# Patient Record
Sex: Female | Born: 1985 | Race: White | Hispanic: No | Smoking: Current every day smoker
Health system: Southern US, Community
[De-identification: ages and names within clinical notes are randomized; demographics above are authoritative.]

## PROBLEM LIST (undated history)

## (undated) ENCOUNTER — Inpatient Hospital Stay (HOSPITAL_COMMUNITY): Payer: Self-pay

## (undated) DIAGNOSIS — G4730 Sleep apnea, unspecified: Secondary | ICD-10-CM

## (undated) HISTORY — PX: KNEE ARTHROSCOPY W/ ACL RECONSTRUCTION: SHX1858

## (undated) HISTORY — PX: CHOLECYSTECTOMY: SHX55

## (undated) HISTORY — PX: TONSILLECTOMY: SUR1361

---

## 2015-06-14 ENCOUNTER — Encounter (HOSPITAL_COMMUNITY): Payer: Self-pay | Admitting: *Deleted

## 2015-06-14 ENCOUNTER — Inpatient Hospital Stay (HOSPITAL_COMMUNITY)
Admission: AD | Admit: 2015-06-14 | Discharge: 2015-06-14 | Disposition: A | Discharge status: Home / Self Care | Payer: Medicaid - Out of State | Source: Ambulatory Visit | Attending: Family Medicine | Admitting: Family Medicine

## 2015-06-14 ENCOUNTER — Inpatient Hospital Stay (HOSPITAL_COMMUNITY): Payer: Medicaid - Out of State

## 2015-06-14 ENCOUNTER — Other Ambulatory Visit: Payer: Self-pay | Admitting: Student

## 2015-06-14 DIAGNOSIS — O99331 Smoking (tobacco) complicating pregnancy, first trimester: Secondary | ICD-10-CM | POA: Diagnosis not present

## 2015-06-14 DIAGNOSIS — O26893 Other specified pregnancy related conditions, third trimester: Secondary | ICD-10-CM | POA: Insufficient documentation

## 2015-06-14 DIAGNOSIS — O26899 Other specified pregnancy related conditions, unspecified trimester: Secondary | ICD-10-CM

## 2015-06-14 DIAGNOSIS — Z3A01 Less than 8 weeks gestation of pregnancy: Secondary | ICD-10-CM | POA: Diagnosis not present

## 2015-06-14 DIAGNOSIS — F17210 Nicotine dependence, cigarettes, uncomplicated: Secondary | ICD-10-CM | POA: Insufficient documentation

## 2015-06-14 DIAGNOSIS — O43891 Other placental disorders, first trimester: Secondary | ICD-10-CM | POA: Diagnosis not present

## 2015-06-14 DIAGNOSIS — O418X10 Other specified disorders of amniotic fluid and membranes, first trimester, not applicable or unspecified: Secondary | ICD-10-CM

## 2015-06-14 DIAGNOSIS — O368910 Maternal care for other specified fetal problems, first trimester, not applicable or unspecified: Secondary | ICD-10-CM | POA: Diagnosis not present

## 2015-06-14 DIAGNOSIS — R109 Unspecified abdominal pain: Secondary | ICD-10-CM | POA: Diagnosis present

## 2015-06-14 DIAGNOSIS — O468X1 Other antepartum hemorrhage, first trimester: Secondary | ICD-10-CM

## 2015-06-14 HISTORY — DX: Sleep apnea, unspecified: G47.30

## 2015-06-14 LAB — CBC
HCT: 37.3 % (ref 36.0–46.0)
HEMOGLOBIN: 12.8 g/dL (ref 12.0–15.0)
MCH: 31.8 pg (ref 26.0–34.0)
MCHC: 34.3 g/dL (ref 30.0–36.0)
MCV: 92.6 fL (ref 78.0–100.0)
PLATELETS: 267 10*3/uL (ref 150–400)
RBC: 4.03 MIL/uL (ref 3.87–5.11)
RDW: 13.4 % (ref 11.5–15.5)
WBC: 11.3 10*3/uL — AB (ref 4.0–10.5)

## 2015-06-14 LAB — ABO/RH: ABO/RH(D): A POS

## 2015-06-14 LAB — WET PREP, GENITAL
CLUE CELLS WET PREP: NONE SEEN
TRICH WET PREP: NONE SEEN
YEAST WET PREP: NONE SEEN

## 2015-06-14 LAB — URINALYSIS, ROUTINE W REFLEX MICROSCOPIC
Bilirubin Urine: NEGATIVE
GLUCOSE, UA: NEGATIVE mg/dL
Hgb urine dipstick: NEGATIVE
KETONES UR: NEGATIVE mg/dL
LEUKOCYTES UA: NEGATIVE
NITRITE: NEGATIVE
PH: 6.5 (ref 5.0–8.0)
Protein, ur: NEGATIVE mg/dL
Specific Gravity, Urine: 1.025 (ref 1.005–1.030)
Urobilinogen, UA: 1 mg/dL (ref 0.0–1.0)

## 2015-06-14 LAB — POCT PREGNANCY, URINE: Preg Test, Ur: POSITIVE — AB

## 2015-06-14 LAB — HCG, QUANTITATIVE, PREGNANCY: hCG, Beta Chain, Quant, S: 17717 m[IU]/mL — ABNORMAL HIGH (ref <5)

## 2015-06-14 MED ORDER — PROMETHAZINE HCL 12.5 MG PO TABS: 12.5000 mg | ORAL_TABLET | Freq: Four times a day (QID) | ORAL | Status: AC | PRN

## 2015-06-14 MED ORDER — METOCLOPRAMIDE HCL 10 MG PO TABS
10.0000 mg | ORAL_TABLET | Freq: Once | ORAL | Status: AC
  Administered 2015-06-14: 10 mg via ORAL
  Filled 2015-06-14: qty 1

## 2015-06-14 NOTE — [facility] Provider Note (Signed)
History     CSN: [id number]  Arrival date and time: 06/14/15 1216   First Provider Initiated Contact with Patient 06/14/15 1320         Chief Complaint  Patient presents with  . Abdominal Pain   HPI  Alexis [name] is a 29 y.o. G4P0030 at [redacted]w[redacted]d by LMP who presents with abdominal pain.  Abdominal pain has been intermittent for the last week. Rates pain 6/10 & describes as cramp like. Has not treated pain. Worse right before she voids. Denies painful urination or any other urinary symptoms.  Denies vaginal bleeding or discharge.  Denies fever.    OB History    Gravida Para Term Preterm AB TAB SAB Ectopic Multiple Living   4 0 0 0 3 0 3 0 0 0       Past Medical History  Diagnosis Date  . Sleep apnea     Past Surgical History  Procedure Laterality Date  . Knee arthroscopy w/ acl reconstruction    . Tonsillectomy    . Cholecystectomy      History reviewed. No pertinent family history.  Social History  Substance Use Topics  . Smoking status: Current Every Day Smoker    Types: Cigarettes  . Smokeless tobacco: None  . Alcohol Use: No    Allergies: No Known Allergies  No prescriptions prior to admission    Review of Systems  Constitutional: Negative.   Gastrointestinal: Positive for nausea, vomiting and abdominal pain. Negative for diarrhea and constipation.  Genitourinary: Negative for dysuria, urgency and frequency.       No vaginal bleeding or discharge   Physical Exam   Blood pressure 122/72, pulse 82, temperature 98.2 F (36.8 C), temperature source Oral, resp. rate 18, height 5\' 5"  (1.651 m), weight 113.127 kg (249 lb 6.4 oz), last menstrual period 04/29/2015.  Physical Exam  Nursing note and vitals reviewed. Constitutional: She is oriented to person, place, and time. She appears well-developed and well-nourished. No distress.  HENT:  Head: Normocephalic and atraumatic.  Eyes: Conjunctivae are normal. Right eye exhibits no discharge. Left eye exhibits  no discharge. No scleral icterus.  Neck: Normal range of motion.  Cardiovascular: Normal rate, regular rhythm and normal heart sounds.   No murmur heard. Respiratory: Effort normal and breath sounds normal. No respiratory distress. She has no wheezes.  GI: Soft. There is no tenderness.  Genitourinary: Vagina normal and uterus normal. Cervix exhibits no motion tenderness, no discharge and no friability.  Neurological: She is alert and oriented to person, place, and time.  Skin: Skin is warm and dry. She is not diaphoretic.  Psychiatric: She has a normal mood and affect. Her behavior is normal. Judgment and thought content normal.    MAU Course  Procedures Results for orders placed or performed during the hospital encounter of 06/14/15 (from the past 24 hour(s))  Urinalysis, Routine w reflex microscopic (not at [facility])     Status: None   Collection Time: 06/14/15 12:50 PM  Result Value Ref Range   Color, Urine YELLOW YELLOW   APPearance CLEAR CLEAR   Specific Gravity, Urine 1.025 1.005 - 1.030   pH 6.5 5.0 - 8.0   Glucose, UA NEGATIVE NEGATIVE mg/dL   Hgb urine dipstick NEGATIVE NEGATIVE   Bilirubin Urine NEGATIVE NEGATIVE   Ketones, ur NEGATIVE NEGATIVE mg/dL   Protein, ur NEGATIVE NEGATIVE mg/dL   Urobilinogen, UA 1.0 0.0 - 1.0 mg/dL   Nitrite NEGATIVE NEGATIVE   Leukocytes, UA NEGATIVE NEGATIVE  Pregnancy, urine  POC     Status: Abnormal   Collection Time: 06/14/15  1:00 PM  Result Value Ref Range   Preg Test, Ur POSITIVE (A) NEGATIVE  Wet prep, genital     Status: Abnormal   Collection Time: 06/14/15  1:20 PM  Result Value Ref Range   Yeast Wet Prep HPF POC NONE SEEN NONE SEEN   Trich, Wet Prep NONE SEEN NONE SEEN   Clue Cells Wet Prep HPF POC NONE SEEN NONE SEEN   WBC, Wet Prep HPF POC FEW (A) NONE SEEN  CBC     Status: Abnormal   Collection Time: 06/14/15  1:32 PM  Result Value Ref Range   WBC 11.3 (H) 4.0 - 10.5 K/uL   RBC 4.03 3.87 - 5.11 MIL/uL   Hemoglobin 12.8 12.0  - 15.0 g/dL   HCT 40.9 81.1 - 91.4 %   MCV 92.6 78.0 - 100.0 fL   MCH 31.8 26.0 - 34.0 pg   MCHC 34.3 30.0 - 36.0 g/dL   RDW 78.2 95.6 - 21.3 %   Platelets 267 150 - 400 K/uL  ABO/Rh     Status: None (Preliminary result)   Collection Time: 06/14/15  1:33 PM  Result Value Ref Range   ABO/RH(D) A POS   hCG, quantitative, pregnancy     Status: Abnormal   Collection Time: 06/14/15  1:33 PM  Result Value Ref Range   hCG, Beta Chain, Quant, S 17717 (H) <5 mIU/mL   US Ob Comp Less 14 Wks  06/14/2015   CLINICAL DATA:  Cramping with urination. Patient is pregnant with gestational age by last menstrual period 6 weeks and 4 days.  EXAM: OBSTETRIC <14 WK [place] AND TRANSVAGINAL OB US  TECHNIQUE: Both transabdominal and transvaginal ultrasound examinations were performed for complete evaluation of the gestation as well as the maternal uterus, adnexal regions, and pelvic cul-de-sac. Transvaginal technique was performed to assess early pregnancy.  COMPARISON:  None.  FINDINGS: Intrauterine gestational sac: Visualized/normal in shape.  Yolk sac:  Visualized  Embryo:  Visualized  Cardiac Activity: Visualized  Heart Rate: 99  bpm  CRL: 4.2 mm corresponding to 6 w 1 d [place] EDC: 02/06/2016  There is a small subchorionic hemorrhage.  Maternal uterus/adnexae: Right and left ovaries are within normal limits. Right ovary measures 3.5 x 2.2 x 2.4 cm. Left ovary measures 3.6 x 1.9 x 2.9 cm. There is a trace amount of free pelvic fluid.  IMPRESSION: Single live intrauterine pregnancy corresponding to 6 weeks and 1 day gestation. Fetal heart rate is decreased measuring 99 beats per min. Obstetric follow-up is recommended.  Small subchorionic hemorrhage.  Normal appearance of the maternal adnexa.   Electronically Signed   By: Ted Mcalpine M.D.   On: 06/14/2015 14:37   US Ob Transvaginal  06/14/2015   CLINICAL DATA:  Cramping with urination. Patient is pregnant with gestational age by last menstrual period 6 weeks and 4 days.   EXAM: OBSTETRIC <14 WK [place] AND TRANSVAGINAL OB US  TECHNIQUE: Both transabdominal and transvaginal ultrasound examinations were performed for complete evaluation of the gestation as well as the maternal uterus, adnexal regions, and pelvic cul-de-sac. Transvaginal technique was performed to assess early pregnancy.  COMPARISON:  None.  FINDINGS: Intrauterine gestational sac: Visualized/normal in shape.  Yolk sac:  Visualized  Embryo:  Visualized  Cardiac Activity: Visualized  Heart Rate: 99  bpm  CRL: 4.2 mm corresponding to 6 w 1 d [place] EDC: 02/06/2016  There is a small subchorionic  hemorrhage.  Maternal uterus/adnexae: Right and left ovaries are within normal limits. Right ovary measures 3.5 x 2.2 x 2.4 cm. Left ovary measures 3.6 x 1.9 x 2.9 cm. There is a trace amount of free pelvic fluid.  IMPRESSION: Single live intrauterine pregnancy corresponding to 6 weeks and 1 day gestation. Fetal heart rate is decreased measuring 99 beats per min. Obstetric follow-up is recommended.  Small subchorionic hemorrhage.  Normal appearance of the maternal adnexa.   Electronically Signed   By: Ted Mcalpine M.D.   On: 06/14/2015 14:37     MDM Labs: CBC, HCG, HIV, Abo/Rh Ultrasound - [facility] @ [redacted]w[redacted]d GC/CT & wet prep Reglan for nausea A pos Assessment and Plan  A: 1. Abdominal pain in pregnancy   2. Subchorionic hematoma in first trimester     P: Discharge home GC/CT, HIV pending Rx phenergan Discussed reasons to return to [facility] Given pregnancy verification letter & list of providers Start prenatal care  Judeth Horn, NP  06/14/2015, 1:09 PM

## 2015-06-14 NOTE — Discharge Instructions (Signed)
First Trimester of Pregnancy The first trimester of pregnancy is from week 1 until the end of week 12 (months 1 through 3). During this time, your baby will begin to develop inside you. At 6-8 weeks, the eyes and face are formed, and the heartbeat can be seen on ultrasound. At the end of 12 weeks, all the baby's organs are formed. Prenatal care is all the medical care you receive before the birth of your baby. Make sure you get good prenatal care and follow all of your doctor's instructions. HOME CARE  Medicines  Take medicine only as told by your doctor. Some medicines are safe and some are not during pregnancy.  Take your prenatal vitamins as told by your doctor.  Take medicine that helps you poop (stool softener) as needed if your doctor says it is okay. Diet  Eat regular, healthy meals.  Your doctor will tell you the amount of weight gain that is right for you.  Avoid raw meat and uncooked cheese.  If you feel sick to your stomach (nauseous) or throw up (vomit):  Eat 4 or 5 small meals a day instead of 3 large meals.  Try eating a few soda crackers.  Drink liquids between meals instead of during meals.  If you have a hard time pooping (constipation):  Eat high-fiber foods like fresh vegetables, fruit, and whole grains.  Drink enough fluids to keep your pee (urine) clear or pale yellow. Activity and Exercise  Exercise only as told by your doctor. Stop exercising if you have cramps or pain in your lower belly (abdomen) or low back.  Try to avoid standing for long periods of time. Move your legs often if you must stand in one place for a long time.  Avoid heavy lifting.  Wear low-heeled shoes. Sit and stand up straight.  You can have sex unless your doctor tells you not to. Relief of Pain or Discomfort  Wear a good support bra if your breasts are sore.  Take warm water baths (sitz baths) to soothe pain or discomfort caused by hemorrhoids. Use hemorrhoid cream if your  doctor says it is okay.  Rest with your legs raised if you have leg cramps or low back pain.  Wear support hose if you have puffy, bulging veins (varicose veins) in your legs. Raise (elevate) your feet for 15 minutes, 3-4 times a day. Limit salt in your diet. Prenatal Care  Schedule your prenatal visits by the twelfth week of pregnancy.  Write down your questions. Take them to your prenatal visits.  Keep all your prenatal visits as told by your doctor. Safety  Wear your seat belt at all times when driving.  Make a list of emergency phone numbers. The list should include numbers for family, friends, the hospital, and police and fire departments. General Tips  Ask your doctor for a referral to a local prenatal class. Begin classes no later than at the start of month 6 of your pregnancy.  Ask for help if you need counseling or help with nutrition. Your doctor can give you advice or tell you where to go for help.  Do not use hot tubs, steam rooms, or saunas.  Do not douche or use tampons or scented sanitary pads.  Do not cross your legs for long periods of time.  Avoid litter boxes and soil used by cats.  Avoid all smoking, herbs, and alcohol. Avoid drugs not approved by your doctor.  Visit your dentist. At home, brush your teeth  with a soft toothbrush. Be gentle when you floss. °GET HELP IF: °· You are dizzy. °· You have mild cramps or pressure in your lower belly. °· You have a nagging pain in your belly area. °· You continue to feel sick to your stomach, throw up, or have watery poop (diarrhea). °· You have a bad smelling fluid coming from your vagina. °· You have pain with peeing (urination). °· You have increased puffiness (swelling) in your face, hands, legs, or ankles. °GET HELP RIGHT AWAY IF:  °· You have a fever. °· You are leaking fluid from your vagina. °· You have spotting or bleeding from your vagina. °· You have very bad belly cramping or pain. °· You gain or lose weight  rapidly. °· You throw up blood. It may look like coffee grounds. °· You are around people who have German measles, fifth disease, or chickenpox. °· You have a very bad headache. °· You have shortness of breath. °· You have any kind of trauma, such as from a fall or a car accident. °Document Released: 03/10/2008 Document Revised: 02/06/2014 Document Reviewed: 08/02/2013 °[employer]® Patient Information ©2015 [employer]. This information is not intended to replace advice given to you by your health care provider. Make sure you discuss any questions you have with your health care provider. °Subchorionic Hematoma °A subchorionic hematoma is a gathering of blood between the outer wall of the placenta and the inner wall of the womb (uterus). The placenta is the organ that connects the fetus to the wall of the uterus. The placenta performs the feeding, breathing (oxygen to the fetus), and waste removal (excretory work) of the fetus.  °Subchorionic hematoma is the most common abnormality found on a result from ultrasonography done during the first trimester or early second trimester of pregnancy. If there has been little or no vaginal bleeding, early small hematomas usually shrink on their own and do not affect your baby or pregnancy. The blood is gradually absorbed over 1-2 weeks. When bleeding starts later in pregnancy or the hematoma is larger or occurs in an older pregnant woman, the outcome may not be as good. Larger hematomas may get bigger, which increases the chances for miscarriage. Subchorionic hematoma also increases the risk of premature detachment of the placenta from the uterus, preterm (premature) labor, and stillbirth. °HOME CARE INSTRUCTIONS °· Stay on bed rest if your health care provider recommends this. Although bed rest will not prevent more bleeding or prevent a miscarriage, your health care provider may recommend bed rest until you are advised otherwise. °· Avoid heavy lifting (more than 10 lb [4.5  kg]), exercise, sexual intercourse, or douching as directed by your health care provider. °· Keep track of the number of pads you use each day and how soaked (saturated) they are. Write down this information. °· Do not use tampons. °· Keep all follow-up appointments as directed by your health care provider. Your health care provider may ask you to have follow-up blood tests or ultrasound tests or both. °SEEK IMMEDIATE MEDICAL CARE IF: °· You have severe cramps in your stomach, back, abdomen, or pelvis. °· You have a fever. °· You pass large clots or tissue. Save any tissue for your health care provider to look at. °· Your bleeding increases or you become lightheaded, feel weak, or have fainting episodes. °Document Released: 01/07/2007 Document Revised: 02/06/2014 Document Reviewed: 04/21/2013 °[employer]® Patient Information ©2015 [employer]. This information is not intended to replace advice given to you by your health   your health care provider. Make sure you discuss any questions you have with your health care provider. ° °

## 2015-06-14 NOTE — [facility] Note (Signed)
Pt had positive HPT.Marland Kitchen Has been having abd cramping on and off x1 week. Denies vag bleeding or discharge.

## 2015-06-15 LAB — GC/CHLAMYDIA PROBE AMP (~~LOC~~) NOT AT ARMC
Chlamydia: NEGATIVE
Neisseria Gonorrhea: NEGATIVE

## 2015-06-15 LAB — HIV ANTIBODY (ROUTINE TESTING W REFLEX): HIV SCREEN 4TH GENERATION: NONREACTIVE

## 2016-04-18 ENCOUNTER — Encounter (HOSPITAL_COMMUNITY): Payer: Self-pay | Admitting: *Deleted

## 2016-07-29 ENCOUNTER — Emergency Department
Admission: EM | Admit: 2016-07-29 | Discharge: 2016-07-29 | Disposition: A | Discharge status: Home / Self Care | Payer: Medicaid Other | Attending: Emergency Medicine | Admitting: Emergency Medicine

## 2016-07-29 ENCOUNTER — Emergency Department: Payer: Medicaid Other

## 2016-07-29 ENCOUNTER — Encounter: Payer: Self-pay | Admitting: Emergency Medicine

## 2016-07-29 DIAGNOSIS — F17210 Nicotine dependence, cigarettes, uncomplicated: Secondary | ICD-10-CM | POA: Insufficient documentation

## 2016-07-29 DIAGNOSIS — R1032 Left lower quadrant pain: Secondary | ICD-10-CM | POA: Diagnosis not present

## 2016-07-29 DIAGNOSIS — N764 Abscess of vulva: Secondary | ICD-10-CM | POA: Insufficient documentation

## 2016-07-29 DIAGNOSIS — R102 Pelvic and perineal pain: Secondary | ICD-10-CM | POA: Diagnosis present

## 2016-07-29 LAB — URINALYSIS COMPLETE WITH MICROSCOPIC (ARMC ONLY)
Bilirubin Urine: NEGATIVE
Glucose, UA: NEGATIVE mg/dL
KETONES UR: NEGATIVE mg/dL
LEUKOCYTES UA: NEGATIVE
NITRITE: NEGATIVE
PH: 5 (ref 5.0–8.0)
PROTEIN: NEGATIVE mg/dL
RBC / HPF: NONE SEEN RBC/hpf (ref 0–5)
SPECIFIC GRAVITY, URINE: 1.01 (ref 1.005–1.030)
Squamous Epithelial / LPF: NONE SEEN

## 2016-07-29 LAB — WET PREP, GENITAL
CLUE CELLS WET PREP: NONE SEEN
SPERM: NONE SEEN
Trich, Wet Prep: NONE SEEN
Yeast Wet Prep HPF POC: NONE SEEN

## 2016-07-29 LAB — CHLAMYDIA/NGC RT PCR (ARMC ONLY)
Chlamydia Tr: NOT DETECTED
N gonorrhoeae: NOT DETECTED

## 2016-07-29 LAB — POCT PREGNANCY, URINE: PREG TEST UR: NEGATIVE

## 2016-07-29 MED ORDER — IBUPROFEN 600 MG PO TABS
600.0000 mg | ORAL_TABLET | Freq: Once | ORAL | Status: AC
  Administered 2016-07-29: 600 mg via ORAL
  Filled 2016-07-29: qty 1

## 2016-07-29 MED ORDER — IBUPROFEN 600 MG PO TABS: 600.0000 mg | ORAL_TABLET | Freq: Three times a day (TID) | ORAL | 0 refills | Status: AC | PRN

## 2016-07-29 NOTE — ED Provider Notes (Signed)
[facility] Emergency Department Provider Note  ____________________________________________   First MD Initiated Contact with Patient 07/29/16 [phone]     (approximate)  I have reviewed the triage vital signs and the nursing notes.   HISTORY  Chief Complaint Vaginal Discharge (vaginal pain)    HPI Alexis [name] is a 30 y.o. female with a reported history of only obesity and sleep apnea who presents for evaluation of 2 lesions on her "vaginal area" that have been draining purulent material as well pain in her left lower quadrant.  She has a 65-month-old baby and she reports that she was told at the time of delivery that she had a spot on her ovary that needs to be evaluated for possible cancer.  She has not done so.  She just moved to this area from [place] and does not have a local doctor.  She states that the sharp stabbing pain in her left lower quadrant has been present for 3 months and not getting any better but is also not getting any worse.  Ranges from mild to severe in intensity .  She is sexually active but implanted contraception.  She reports that her urine has been dark and foul-smelling recently. She denies any vaginal discharge but states that the 2 spots on her vulva will show up, drained material, go away, then come back.  These have also been present for months.  She denies fever/chills, chest pain, shortness of breath, nausea, vomiting, dysuria, dyspareunia.   Past Medical History:  Diagnosis Date  . Sleep apnea     There are no active problems to display for this patient.   Past Surgical History:  Procedure Laterality Date  . CHOLECYSTECTOMY    . KNEE ARTHROSCOPY W/ ACL RECONSTRUCTION    . TONSILLECTOMY      Prior to Admission medications   Medication Sig Start Date End Date Taking? Authorizing Provider  ibuprofen (ADVIL,MOTRIN) 600 MG tablet Take 1 tablet (600 mg total) by mouth 3 (three) times daily with meals as needed for moderate  pain. 07/29/16   Loleta Rose, MD  promethazine (PHENERGAN) 12.5 MG tablet Take 1 tablet (12.5 mg total) by mouth every 6 (six) hours as needed for nausea or vomiting. 06/14/15   Duane Lope, NP    Allergies Review of patient's allergies indicates no known allergies.  No family history on file.  Social History Social History  Substance Use Topics  . Smoking status: Current Every Day Smoker    Types: Cigarettes  . Smokeless tobacco: Never Used  . Alcohol use No    Review of Systems Constitutional: No fever/chills Eyes: No visual changes. ENT: No sore throat. Cardiovascular: Denies chest pain. Respiratory: Denies shortness of breath. Gastrointestinal: Left lower quadrant pain 3 months, waxes and wanes.  No nausea, no vomiting.  No diarrhea.  No constipation. Genitourinary: Negative for dysuria. Dark and foul-smelling urine recently.  2 occasionally draining lesions in her vulvar region. Musculoskeletal: Negative for back pain. Skin: Negative for rash. Neurological: Negative for headaches, focal weakness or numbness.  10-point ROS otherwise negative.  ____________________________________________   PHYSICAL EXAM:  VITAL SIGNS: ED Triage Vitals  Enc Vitals [name]     BP 07/29/16 1009 132/81     Pulse Rate 07/29/16 1009 87     Resp 07/29/16 1009 18     Temp 07/29/16 1009 98.1 F (36.7 C)     Temp Source 07/29/16 1009 Oral     SpO2 07/29/16 1009 97 %  Weight 07/29/16 1011 265 lb (120.2 kg)     Height 07/29/16 1011 5\' 5"  (1.651 m)     Head Circumference --      Peak Flow --      Pain Score --      Pain Loc --      Pain Edu? --      Excl. in GC? --     Constitutional: Alert and oriented. Well appearing and in no acute distress. Eyes: Conjunctivae are normal. PERRL. EOMI. Head: Atraumatic. Nose: No congestion/rhinnorhea. Mouth/Throat: Mucous membranes are moist.  Oropharynx non-erythematous. Neck: No stridor.  No meningeal signs.   Cardiovascular: Normal  rate, regular rhythm. Good peripheral circulation. Grossly normal heart sounds. Respiratory: Normal respiratory effort.  No retractions. Lungs CTAB. Gastrointestinal: Soft and nontender. No distention.  Genitourinary: The patient has 2 small lesions on her vulva, one on the upper left and one on the lower right.  They look like abscesses that have drained.  There is no surrounding erythema or fluctuance or induration.  They are nontender to palpation.  There is no evidence of active infection at this time.  There are no external and fascicular lesions.  There is a mild amount of whitish discharge in the vagina.  The cervix is normal in appearance.  On bimanual exam the patient has left adnexal tenderness but no cervical motion tenderness.  Chaperone was present throughout the exam. Musculoskeletal: No lower extremity tenderness nor edema. No gross deformities of extremities. Neurologic:  Normal speech and language. No gross focal neurologic deficits are appreciated.  Skin:  Skin is warm, dry and intact. No rash noted. Psychiatric: Mood and affect are normal. Speech and behavior are normal.  ____________________________________________   LABS (all labs ordered are listed, but only abnormal results are displayed)  Labs Reviewed  WET PREP, GENITAL - Abnormal; Notable for the following:       Result Value   WBC, Wet Prep HPF POC FEW (*)    All other components within normal limits  URINALYSIS COMPLETEWITH MICROSCOPIC ([facility] ONLY) - Abnormal; Notable for the following:    Color, Urine YELLOW (*)    APPearance CLEAR (*)    Hgb urine dipstick 1+ (*)    Bacteria, UA RARE (*)    All other components within normal limits  CHLAMYDIA/NGC RT PCR ([facility] ONLY)  POC URINE PREG, ED  POCT PREGNANCY, URINE   ____________________________________________  EKG  None - EKG not ordered by ED physician ____________________________________________  RADIOLOGY   [place] Transvaginal Non-ob  Result  Date: 07/29/2016 CLINICAL DATA:  Acute left lower quadrant abdominal pain. EXAM: TRANSABDOMINAL AND TRANSVAGINAL ULTRASOUND OF PELVIS TECHNIQUE: Both transabdominal and transvaginal ultrasound examinations of the pelvis were performed. Transabdominal technique was performed for global imaging of the pelvis including uterus, ovaries, adnexal regions, and pelvic cul-de-sac. It was necessary to proceed with endovaginal exam following the transabdominal exam to visualize the endometrium. COMPARISON:  None FINDINGS: Uterus Measurements: 7.4 x 4.9 x 2.5 cm. No fibroids or other mass visualized. Endometrium Thickness: 4 mm which is within normal limits. No focal abnormality visualized. Right ovary Measurements: 3.2 x 2.4 x 2.2 cm. Normal appearance/no adnexal mass. Left ovary Measurements: 3.3 x 2.0 x 1.8 cm. Normal appearance/no adnexal mass. Other findings No abnormal free fluid. IMPRESSION: No significant abnormality seen in the pelvis. Electronically Signed   By: Lupita RaiderJames  Green Jr, M.D.   On: 07/29/2016 12:25   [place] Pelvis Complete  Result Date: 07/29/2016 CLINICAL DATA:  Acute left lower  quadrant abdominal pain. EXAM: TRANSABDOMINAL AND TRANSVAGINAL ULTRASOUND OF PELVIS TECHNIQUE: Both transabdominal and transvaginal ultrasound examinations of the pelvis were performed. Transabdominal technique was performed for global imaging of the pelvis including uterus, ovaries, adnexal regions, and pelvic cul-de-sac. It was necessary to proceed with endovaginal exam following the transabdominal exam to visualize the endometrium. COMPARISON:  None FINDINGS: Uterus Measurements: 7.4 x 4.9 x 2.5 cm. No fibroids or other mass visualized. Endometrium Thickness: 4 mm which is within normal limits. No focal abnormality visualized. Right ovary Measurements: 3.2 x 2.4 x 2.2 cm. Normal appearance/no adnexal mass. Left ovary Measurements: 3.3 x 2.0 x 1.8 cm. Normal appearance/no adnexal mass. Other findings No abnormal free fluid.  IMPRESSION: No significant abnormality seen in the pelvis. Electronically Signed   By: Lupita Raider, M.D.   On: 07/29/2016 12:25    ____________________________________________   PROCEDURES  Procedure(s) performed:   Procedures   Critical Care performed: No ____________________________________________   INITIAL IMPRESSION / ASSESSMENT AND PLAN / ED COURSE  Pertinent labs & imaging results that were available during my care of the patient were reviewed by me and considered in my medical decision making (see chart for details).  The patient has normal vital signs and has had symptoms for 3 months.  Her medical screening exam is reassuring that she does not have an emergent or acute medical condition at this time.  She does not have any abscesses that required incision and drainage.  There is no evidence of any Bartholin's cyst/abscess.  Given the fact that she was told 7 months ago that she had something on her ovary and that she should have it checked out, I we will obtain a pelvic and transvaginal ultrasound at this time but I told her that it is very important she follow up with GYN.  We are currently awaiting wet prep, GC/chlamydia, urinalysis, and ultrasound results.   Clinical Course  Comment By Time  Unremarkable U/S.  VSS, afebrile.  NAD, no current pain.  I explained that her workup today is reassuring and that there is no indication for antibiotics.  I will provide the number for her to call to establish a PCP and also follow up with OB/GYN. I gave my usual and customary return precautions. Loleta Rose, MD 10/24 1255    ____________________________________________  FINAL CLINICAL IMPRESSION(S) / ED DIAGNOSES  Final diagnoses:  Vulvar abscess  LLQ pain     MEDICATIONS GIVEN DURING THIS VISIT:  Medications  ibuprofen (ADVIL,MOTRIN) tablet 600 mg (600 mg Oral Given 07/29/16 1137)     NEW OUTPATIENT MEDICATIONS STARTED DURING THIS VISIT:  Discharge Medication  List as of 07/29/2016 12:59 PM    START taking these medications   Details  ibuprofen (ADVIL,MOTRIN) 600 MG tablet Take 1 tablet (600 mg total) by mouth 3 (three) times daily with meals as needed for moderate pain., Starting Tue 07/29/2016, Print        Discharge Medication List as of 07/29/2016 12:59 PM      Discharge Medication List as of 07/29/2016 12:59 PM       Note:  This document was prepared using Dragon voice recognition software and may include unintentional dictation errors.    Loleta Rose, MD 07/29/16 [phone]

## 2016-07-29 NOTE — ED Notes (Signed)
Pelvic exam completed. Swabs sent to lab.

## 2016-07-29 NOTE — ED Triage Notes (Signed)
States she has 2 small abscess areas to vaginal area  Also having some left lower abd pain  Denies any dysuria but states urine is dark and foul smelling

## 2016-07-29 NOTE — Discharge Instructions (Signed)
As we discussed, your workup today was reassuring.  Though we do not know exactly what is causing your symptoms, it appears that you have no emergent medical condition at this time and that you are safe to go home and follow up as recommended in this paperwork.  Your abscesses have already drained and do not need to be opened and do not need antibiotics currently.  Please read through the included information about abscess care in general.  Please return immediately to the Emergency Department if you develop any new or worsening symptoms that concern you.

## 2016-07-29 NOTE — ED Notes (Signed)
Resting at present   Awaiting test results

## 2016-09-11 IMAGING — US US OB COMP LESS 14 WK
1 series · 15 of 28 positions shown · non-contrast
Comparison: None.

CLINICAL DATA: Cramping with urination. Patient is pregnant with
gestational age by last menstrual period 6 weeks and 4 days.

EXAM:
OBSTETRIC <14 WK US AND TRANSVAGINAL OB US
TECHNIQUE: Both transabdominal and transvaginal ultrasound examinations were
performed for complete evaluation of the gestation as well as the
maternal uterus, adnexal regions, and pelvic cul-de-sac.
Transvaginal technique was performed to assess early pregnancy.

[Series 1: us ob comp less 14 wk · 85 acquisitions, 15 frames shown]
[im 1/85]
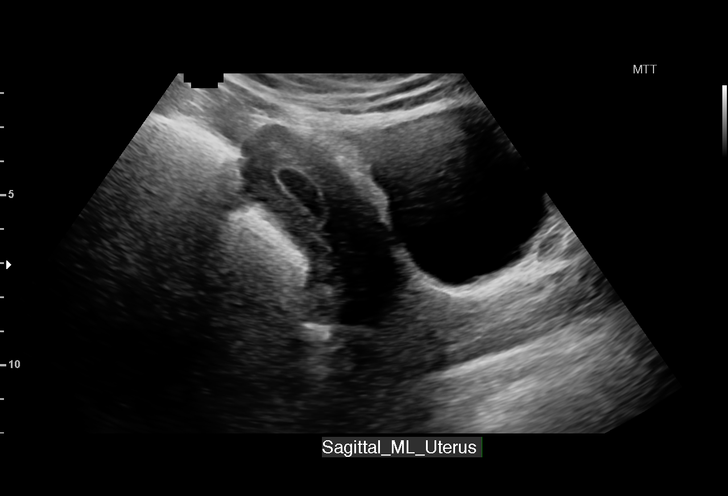
[im 7/85]
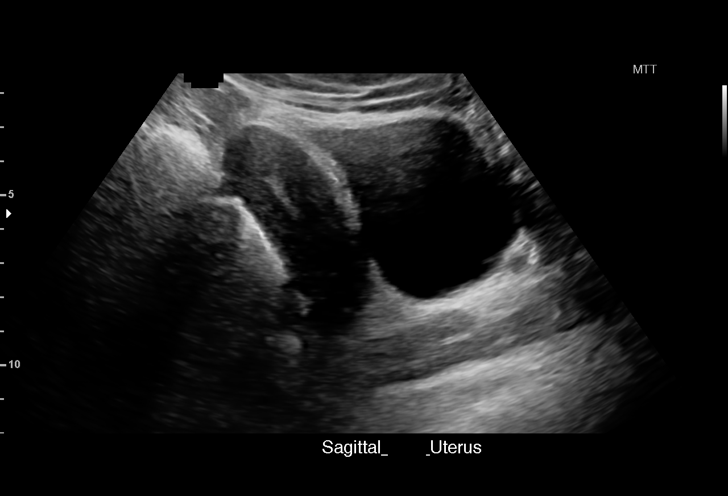
[im 13/85]
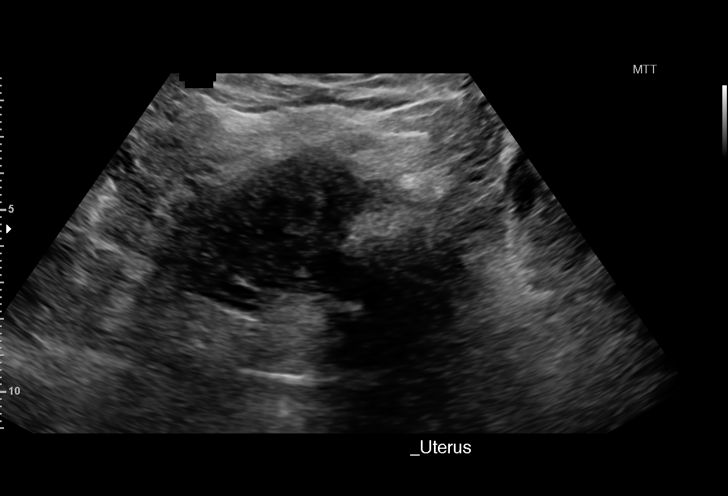
[im 19/85]
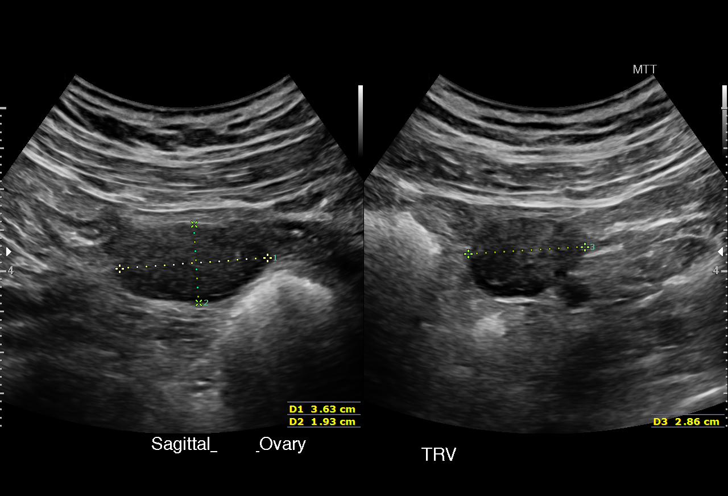
[im 25/85]
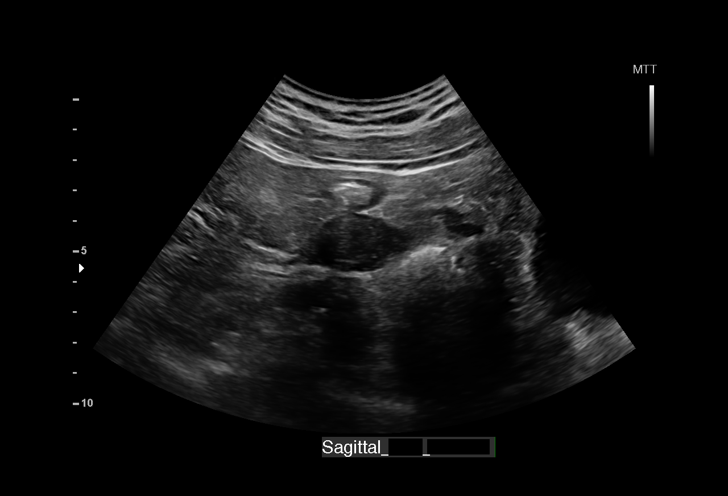
[im 32/85]
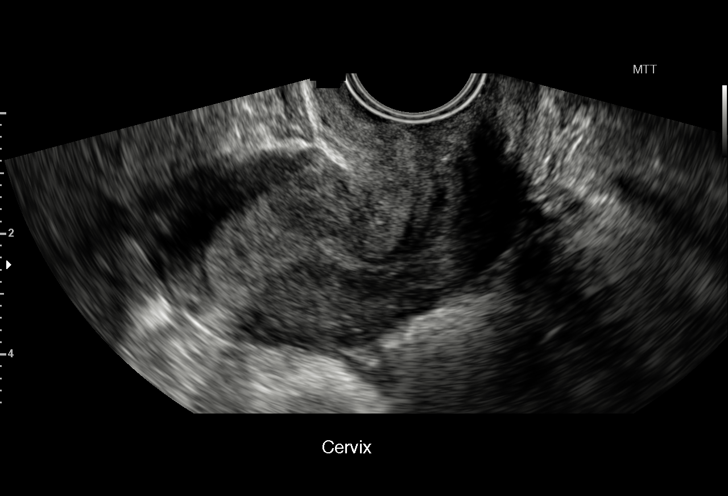
[im 38/85]
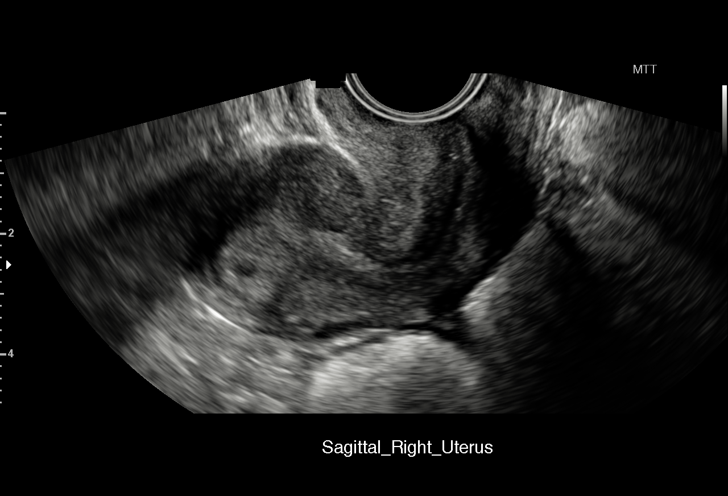
[im 44/85]
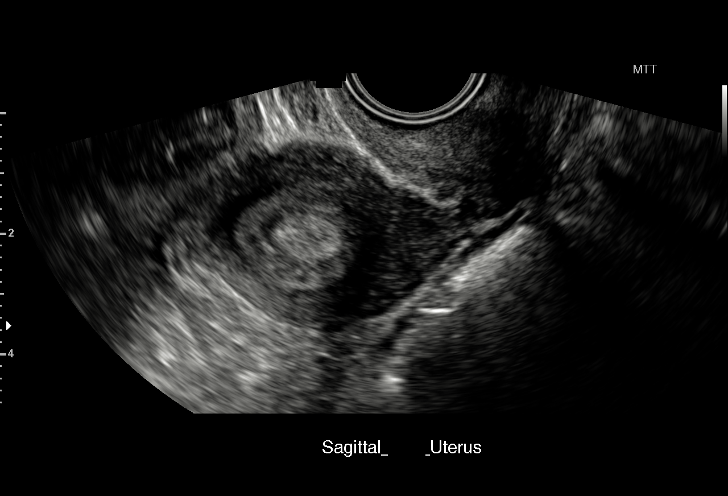
[im 47/85]
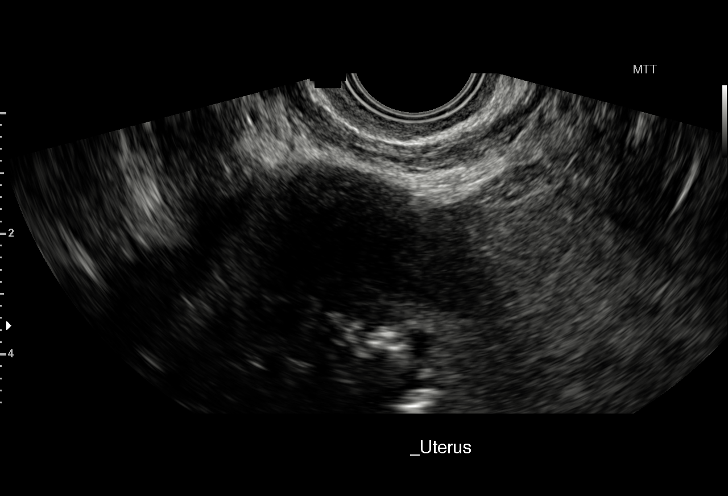
[im 53/85]
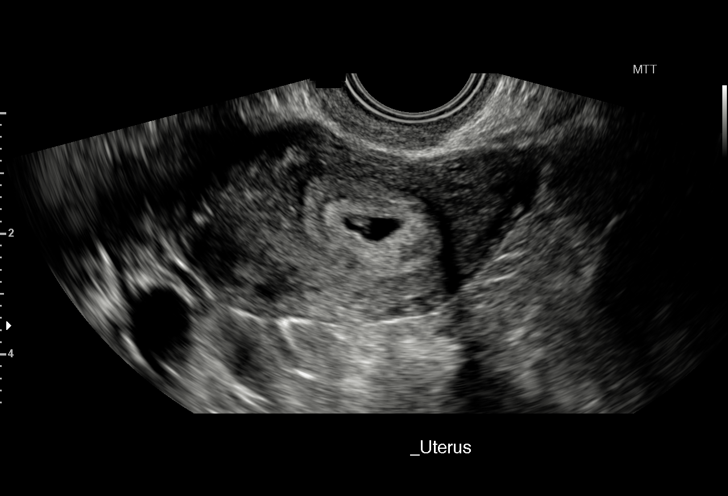
[im 60/85]
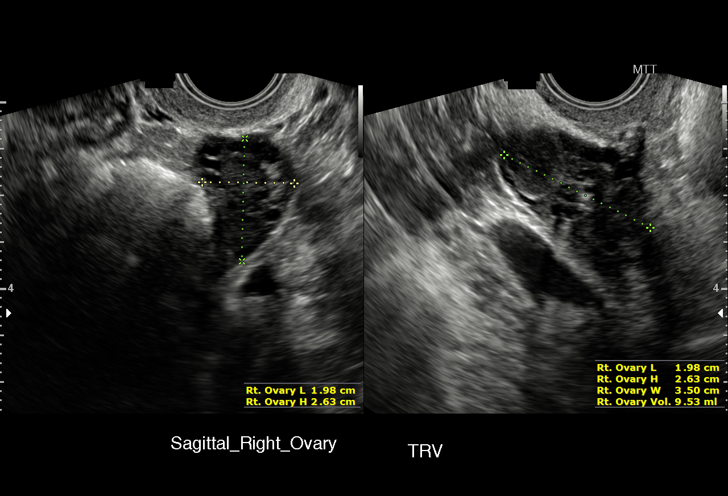
[im 66/85]
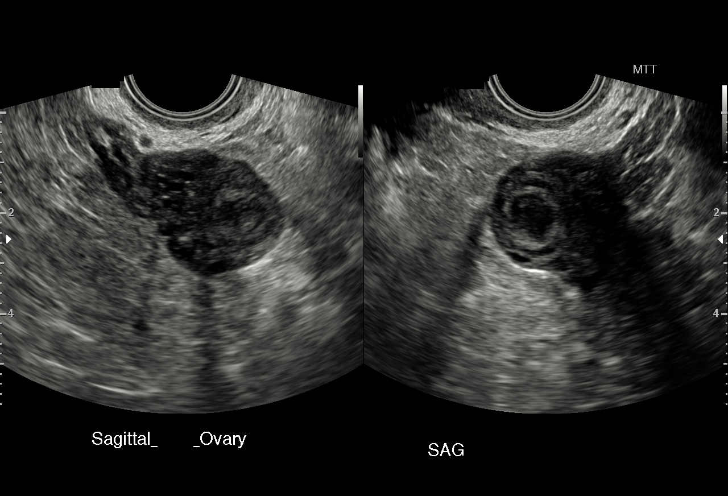
[im 72/85]
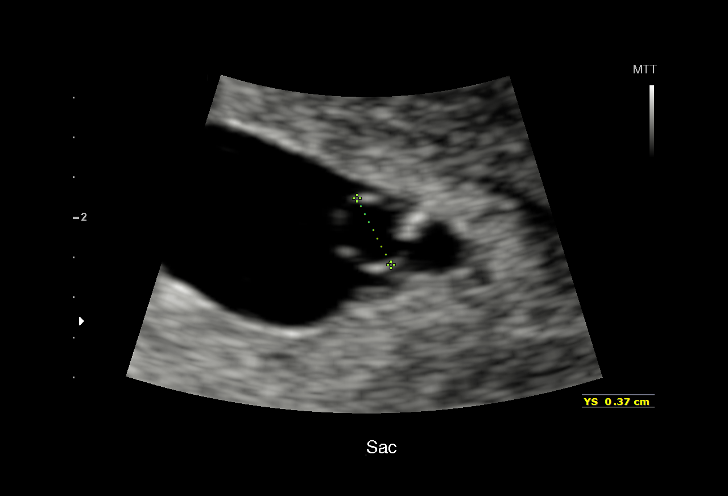
[im 78/85]
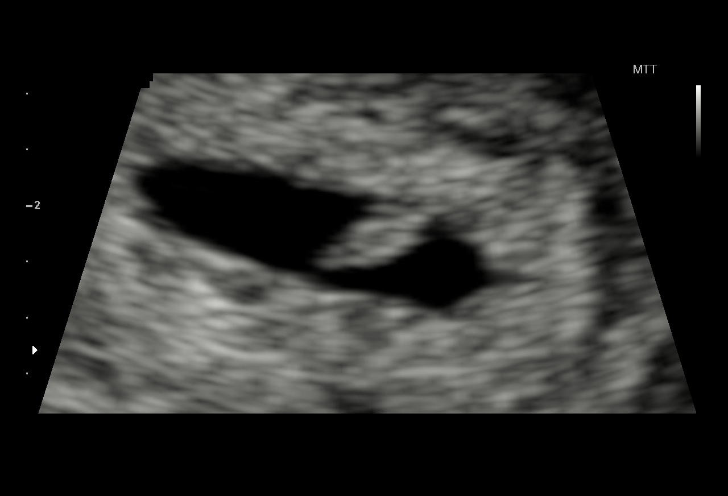
[im 85/85]
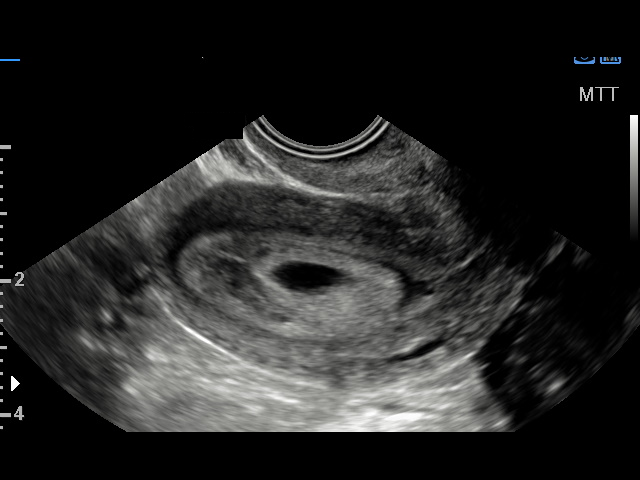

[15 of 28 positions shown; findings below may reference images not displayed]

FINDINGS: Intrauterine gestational sac: Visualized/normal in shape.

Yolk sac:  Visualized

Embryo:  Visualized

Cardiac Activity: Visualized

Heart Rate: 99  bpm

CRL: 4.2 mm corresponding to 6 w 1 d US EDC: 02/06/2016

There is a small subchorionic hemorrhage.

Maternal uterus/adnexae: Right and left ovaries are within normal
limits. Right ovary measures 3.5 x 2.2 x 2.4 cm. Left ovary measures
3.6 x 1.9 x 2.9 cm. There is a trace amount of free pelvic fluid.
IMPRESSION: Single live intrauterine pregnancy corresponding to 6 weeks and 1
day gestation. Fetal heart rate is decreased measuring 99 beats per
min. Obstetric follow-up is recommended.

Small subchorionic hemorrhage.

Normal appearance of the maternal adnexa.

## 2018-10-03 IMAGING — US US TRANSVAGINAL NON-OB
1 series · 14 of 25 positions shown · non-contrast
Comparison: None

CLINICAL DATA: Acute left lower quadrant abdominal pain.



[Series 1: us transvaginal non-ob · 0.22mm/px · 14 of 104 slices shown]
[im 1/104]
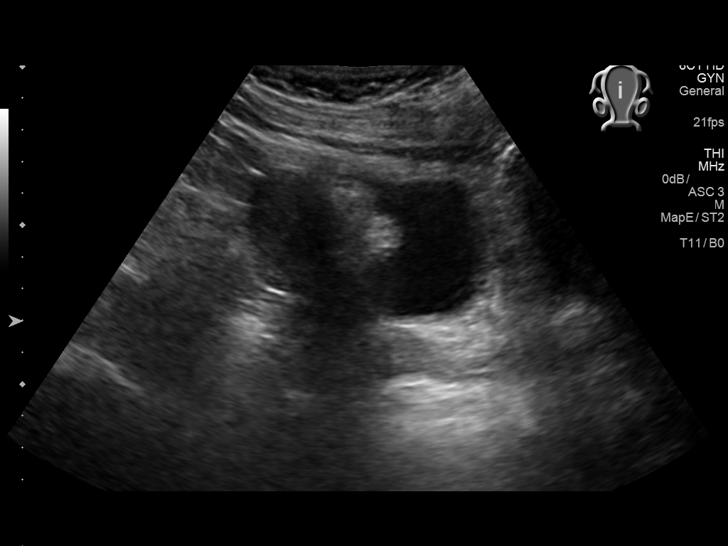
[im 9/104]
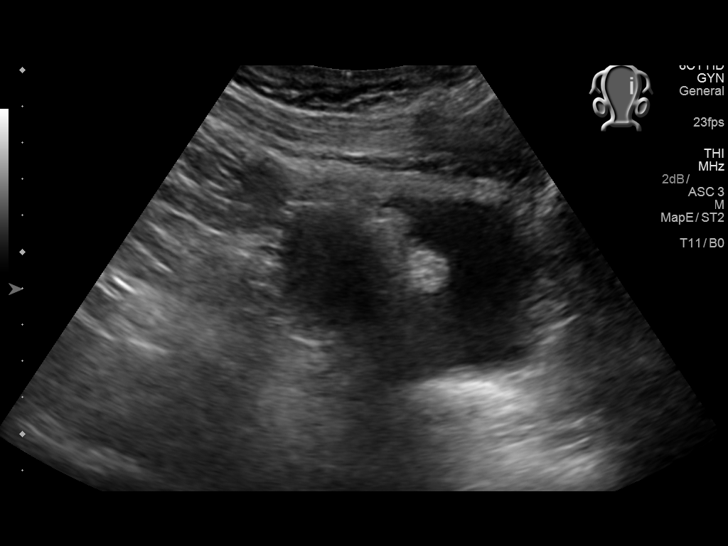
[im 18/104]
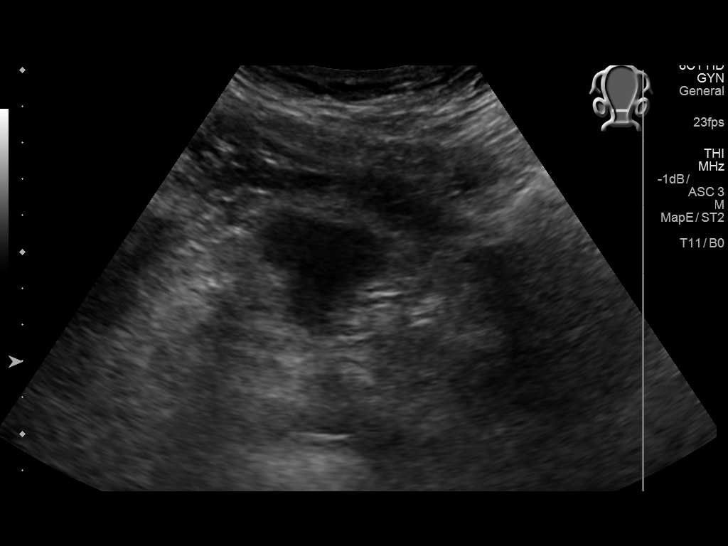
[im 26/104]
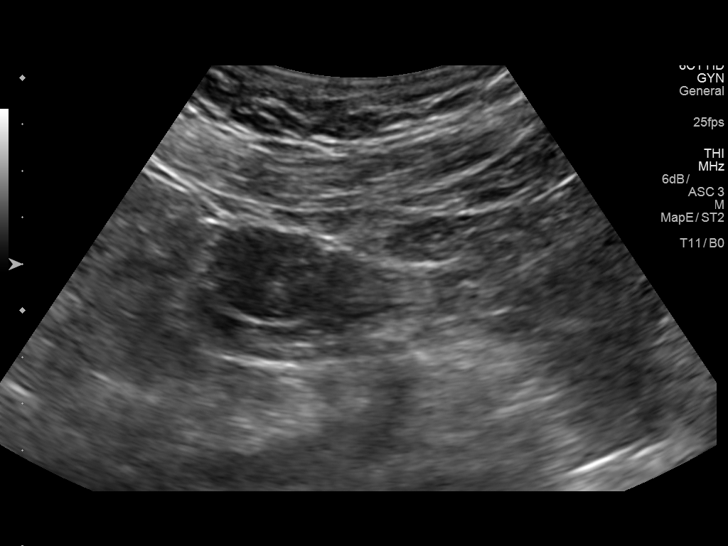
[im 35/104]
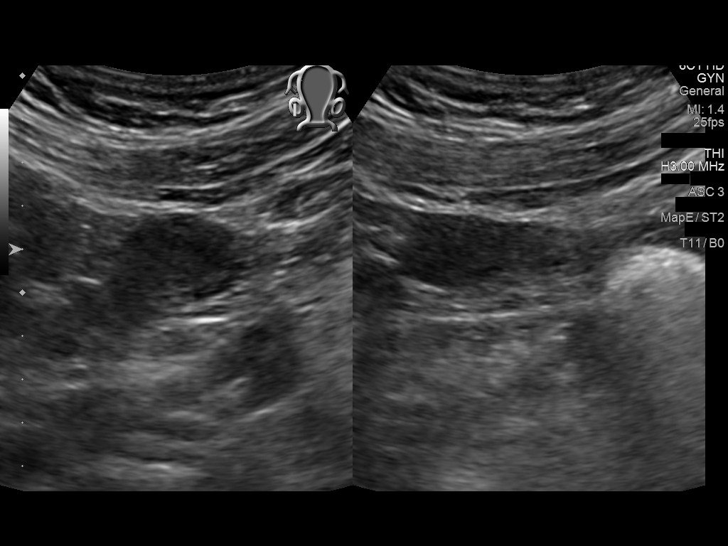
[im 39/104]
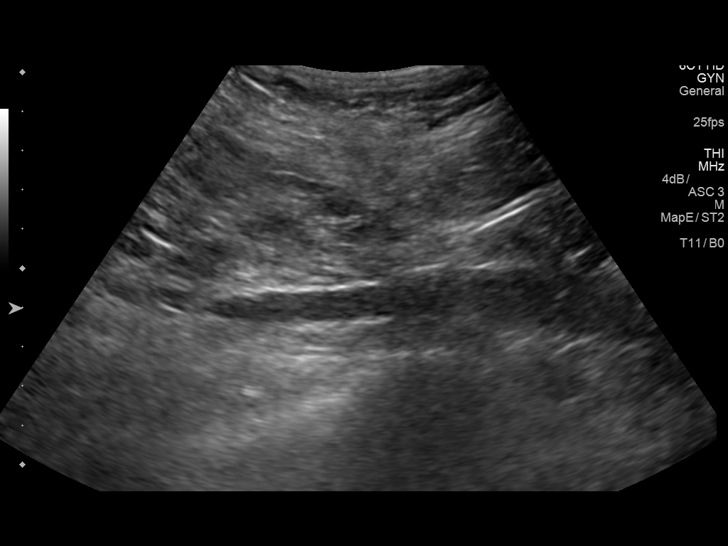
[im 48/104]
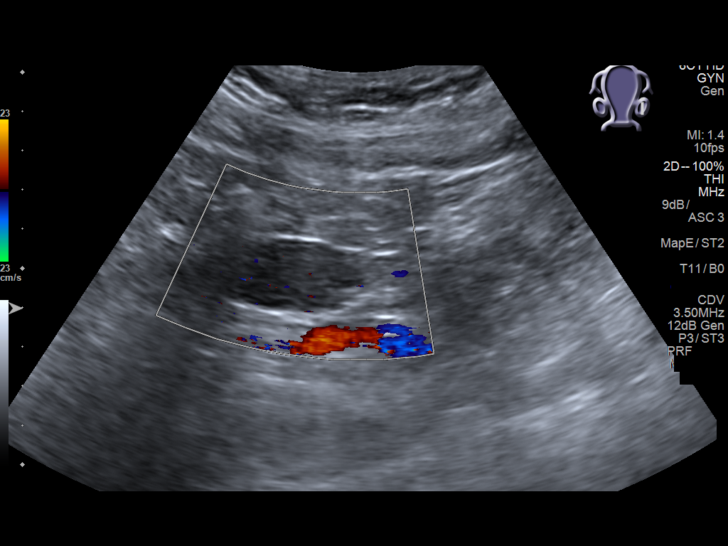
[im 56/104]
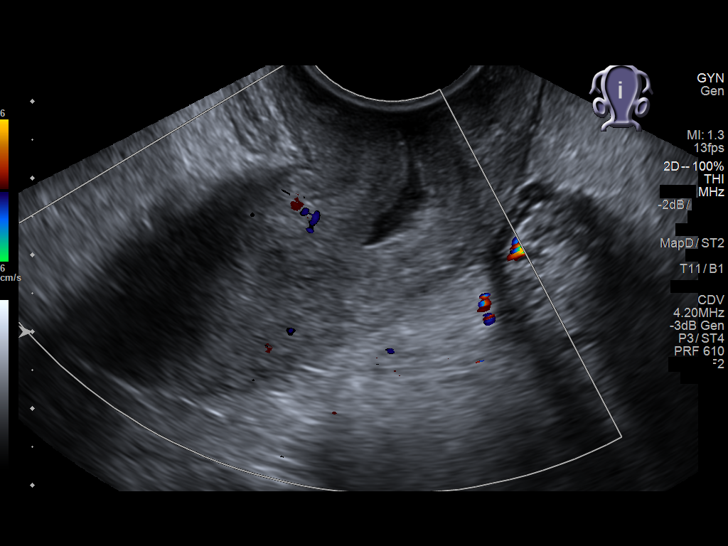
[im 65/104]
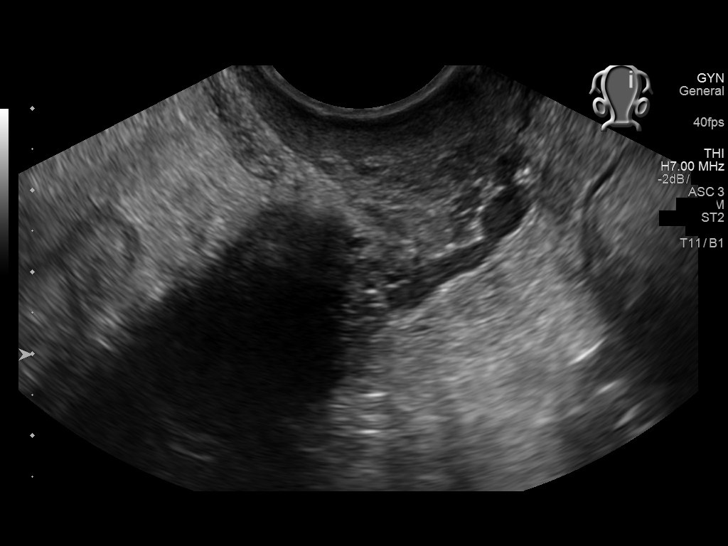
[im 69/104]
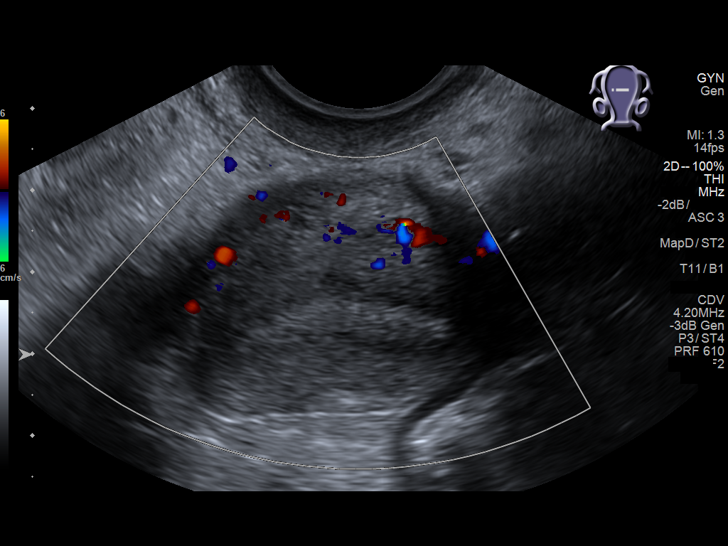
[im 78/104]
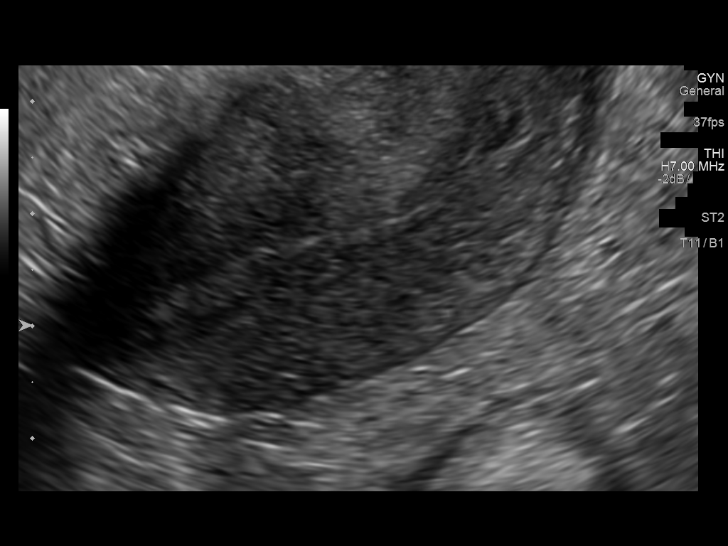
[im 86/104]
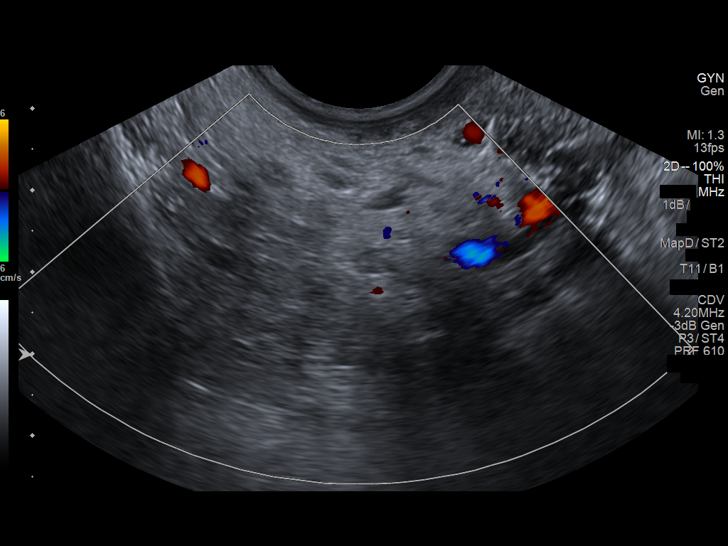
[im 95/104]
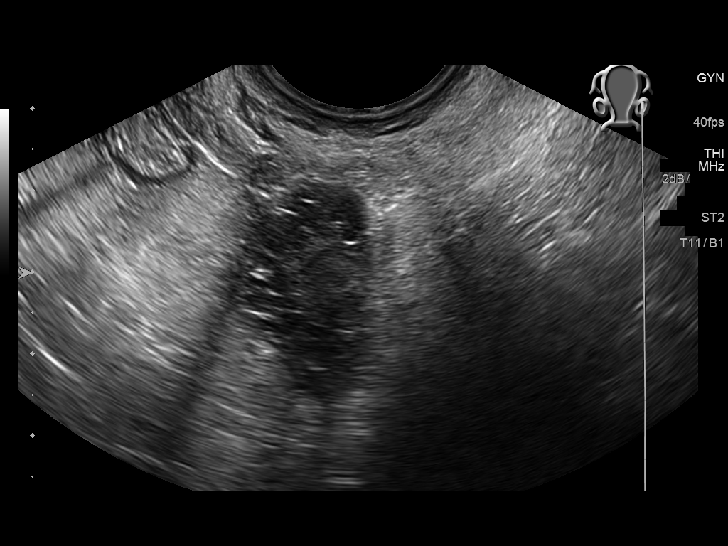
[im 104/104]
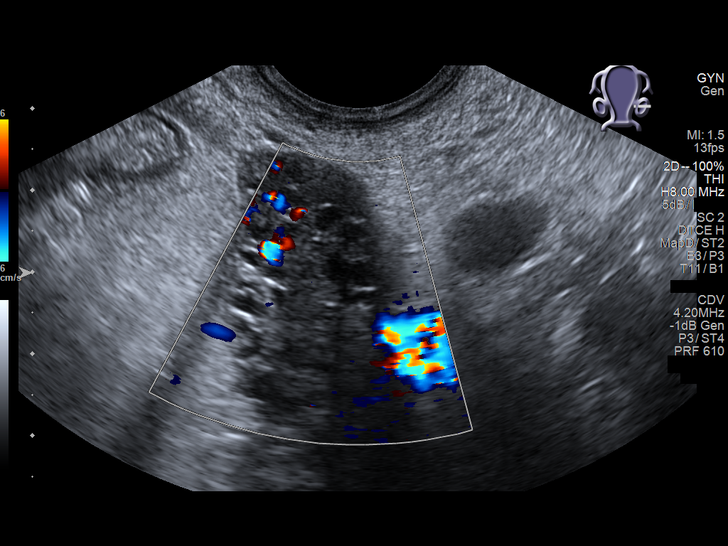

[14 of 25 positions shown; findings below may reference images not displayed]

FINDINGS: Uterus

Measurements: 7.4 x 4.9 x 2.5 cm. No fibroids or other mass
visualized.

Endometrium

Thickness: 4 mm which is within normal limits. No focal abnormality
visualized.

Right ovary

Measurements: 3.2 x 2.4 x 2.2 cm. Normal appearance/no adnexal mass.

Left ovary

Measurements: 3.3 x 2.0 x 1.8 cm. Normal appearance/no adnexal mass.

Other findings

No abnormal free fluid.
IMPRESSION: No significant abnormality seen in the pelvis.
